# Patient Record
Sex: Female | Born: 1960 | Race: White | Hispanic: No | Marital: Married | Smoking: Never smoker
Health system: Southern US, Community
[De-identification: ages and names within clinical notes are randomized; demographics above are authoritative.]

## PROBLEM LIST (undated history)

## (undated) DIAGNOSIS — J45909 Unspecified asthma, uncomplicated: Secondary | ICD-10-CM

## (undated) DIAGNOSIS — T7840XA Allergy, unspecified, initial encounter: Secondary | ICD-10-CM

## (undated) DIAGNOSIS — F419 Anxiety disorder, unspecified: Secondary | ICD-10-CM

## (undated) DIAGNOSIS — IMO0002 Reserved for concepts with insufficient information to code with codable children: Secondary | ICD-10-CM

## (undated) DIAGNOSIS — R011 Cardiac murmur, unspecified: Secondary | ICD-10-CM

## (undated) HISTORY — DX: Cardiac murmur, unspecified: R01.1

## (undated) HISTORY — DX: Reserved for concepts with insufficient information to code with codable children: IMO0002

## (undated) HISTORY — DX: Unspecified asthma, uncomplicated: J45.909

## (undated) HISTORY — DX: Anxiety disorder, unspecified: F41.9

## (undated) HISTORY — PX: TONSILLECTOMY: SUR1361

## (undated) HISTORY — DX: Allergy, unspecified, initial encounter: T78.40XA

## (undated) HISTORY — PX: ABDOMINAL HYSTERECTOMY: SHX81

---

## 1999-10-06 ENCOUNTER — Emergency Department (HOSPITAL_COMMUNITY): Admission: EM | Admit: 1999-10-06 | Discharge: 1999-10-06 | Payer: Self-pay | Admitting: Internal Medicine

## 2001-08-09 ENCOUNTER — Emergency Department (HOSPITAL_COMMUNITY): Admission: EM | Admit: 2001-08-09 | Discharge: 2001-08-09 | Payer: Self-pay | Admitting: *Deleted

## 2001-08-09 ENCOUNTER — Encounter: Payer: Self-pay | Admitting: Emergency Medicine

## 2001-08-12 ENCOUNTER — Emergency Department (HOSPITAL_COMMUNITY): Admission: EM | Admit: 2001-08-12 | Discharge: 2001-08-12 | Payer: Self-pay | Admitting: Emergency Medicine

## 2005-04-08 ENCOUNTER — Other Ambulatory Visit: Admission: RE | Admit: 2005-04-08 | Discharge: 2005-04-08 | Payer: Self-pay | Admitting: Gynecology

## 2005-12-14 ENCOUNTER — Ambulatory Visit (HOSPITAL_COMMUNITY): Admission: RE | Admit: 2005-12-14 | Discharge: 2005-12-14 | Payer: Self-pay | Admitting: Orthopaedic Surgery

## 2007-03-12 ENCOUNTER — Other Ambulatory Visit: Admission: RE | Admit: 2007-03-12 | Discharge: 2007-03-12 | Payer: Self-pay | Admitting: Gynecology

## 2011-07-15 DIAGNOSIS — F419 Anxiety disorder, unspecified: Secondary | ICD-10-CM | POA: Insufficient documentation

## 2011-07-15 DIAGNOSIS — M1990 Unspecified osteoarthritis, unspecified site: Secondary | ICD-10-CM | POA: Insufficient documentation

## 2011-11-27 ENCOUNTER — Other Ambulatory Visit: Payer: Self-pay | Admitting: Family Medicine

## 2011-11-27 DIAGNOSIS — Z1231 Encounter for screening mammogram for malignant neoplasm of breast: Secondary | ICD-10-CM

## 2011-12-18 DIAGNOSIS — E66813 Obesity, class 3: Secondary | ICD-10-CM | POA: Insufficient documentation

## 2012-07-03 ENCOUNTER — Ambulatory Visit: Payer: Self-pay

## 2012-07-03 ENCOUNTER — Ambulatory Visit: Payer: Self-pay | Admitting: Internal Medicine

## 2012-07-03 VITALS — BP 131/82 | HR 94 | Temp 98.2°F | Resp 18 | Ht 63.5 in | Wt 245.0 lb

## 2012-07-03 DIAGNOSIS — IMO0002 Reserved for concepts with insufficient information to code with codable children: Secondary | ICD-10-CM

## 2012-07-03 MED ORDER — HYDROCODONE-ACETAMINOPHEN 5-325 MG PO TABS: 1.0000 | ORAL_TABLET | Freq: Four times a day (QID) | ORAL | Status: DC | PRN

## 2012-07-03 NOTE — Patient Instructions (Addendum)
Knee Sprain  A knee sprain is a tear in one of the strong, fibrous tissues that connect the bones (ligaments) in your knee. The severity of the sprain depends on how much of the ligament is torn. The tear can be either partial or complete.  CAUSES   Often, sprains are a result of a fall or injury. The force of the impact causes the fibers of your ligament to stretch too much. This excess tension causes the fibers of your ligament to tear.  SYMPTOMS   You may have some loss of motion in your knee. Other symptoms include:   Bruising.   Tenderness.   Swelling.  DIAGNOSIS   In order to diagnose knee sprain, your caregiver will physically examine your knee to determine how torn the ligament is. Your caregiver may also suggest an X-ray exam of your knee to make sure no bones are broken.  TREATMENT   If your ligament is only partially torn, treatment usually involves keeping the knee in a fixed position (immobilization) or bracing your knee for activities that require movement for several weeks. To do this, your caregiver will apply a bandage, cast, or splint to keep your knee from moving or support your knee during movement until it heals. For a partially torn ligament, the healing process usually takes 4 to 6 weeks.  If your ligament is completely torn, depending on which ligament it is, you may need surgery to reconnect the ligament to the bone or reconstruct it. After surgery, a cast or splint may be applied and will need to stay on your knee for 4 to 6 weeks while your ligament heals.  HOME CARE INSTRUCTIONS   Keep your injured knee elevated to decrease swelling.   To ease pain and swelling, apply ice to your knee twice a day, for 2 to 3 days:   Put ice in a plastic bag.   Place a towel between your skin and the bag.   Leave the ice on for 15 minutes.   Only take over-the-counter or prescription medicine for pain as directed by your caregiver.   Do not leave your knee unprotected until pain and stiffness go  away (usually 4 to 6 weeks).   Do not allow your cast or splint to get wet. If you have been instructed not to remove it, cover your cast or splint with a plastic bag when you shower or bathe. Do not swim.   Your caregiver may suggest exercises for you to do during your recovery to prevent or limit permanent weakness and stiffness.  SEEK IMMEDIATE MEDICAL CARE IF:   Your cast or splint becomes damaged.   Your pain becomes worse.  MAKE SURE YOU:   Understand these instructions.   Will watch your condition.   Will get help right away if you are not doing well or get worse.  Document Released: 04/22/2005 Document Revised: 07/15/2011 Document Reviewed: 04/06/2011  [employer] Patient Information 2013 [employer].

## 2012-07-03 NOTE — Progress Notes (Signed)
  Subjective:    Patient ID: Julie Howell, female    DOB: 26-Sep-1960, 52 y.o.   MRN: [id number]  HPI Over 2 weeks ago slipped on wet/snow floor and fell onto right knee. Pain has persisted and still can not flex knee due to pain and possible swelling. No previous knee injury. Also struck head when fell but no hx for concussion , memory intact, does have HAs but like her usual and no hx of NMS loss. Fell full weight ont right patella   Review of Systems     Objective:   Physical Exam  Vitals reviewed. Constitutional: She is oriented to person, place, and time. She appears well-nourished. She appears distressed.  Eyes: EOM are normal. No scleral icterus.  Pulmonary/Chest: Effort normal.  Musculoskeletal:       Right knee: She exhibits decreased range of motion, swelling, abnormal patellar mobility and bony tenderness. Tenderness found.  Neurological: She is alert and oriented to person, place, and time. No cranial nerve deficit. She exhibits normal muscle tone. Coordination and gait abnormal.  Neuro intact  Skin: Skin is warm.  Psychiatric: She has a normal mood and affect.    [facility] reading (PRIMARY) by  Dr Perrin Maltese no fx seen        Assessment & Plan:  Knee brace and crutches HC/Refer to orthopedist [place]

## 2015-07-03 ENCOUNTER — Emergency Department (INDEPENDENT_AMBULATORY_CARE_PROVIDER_SITE_OTHER): Payer: BLUE CROSS/BLUE SHIELD

## 2015-07-03 ENCOUNTER — Encounter (HOSPITAL_COMMUNITY): Payer: Self-pay | Admitting: Emergency Medicine

## 2015-07-03 ENCOUNTER — Emergency Department (HOSPITAL_COMMUNITY)
Admission: EM | Admit: 2015-07-03 | Discharge: 2015-07-03 | Disposition: A | Discharge status: Home / Self Care | Payer: BLUE CROSS/BLUE SHIELD | Source: Home / Self Care | Attending: Family Medicine | Admitting: Family Medicine

## 2015-07-03 DIAGNOSIS — S93402A Sprain of unspecified ligament of left ankle, initial encounter: Secondary | ICD-10-CM

## 2015-07-03 MED ORDER — TRAMADOL HCL 50 MG PO TABS: ORAL_TABLET | ORAL | Status: DC

## 2015-07-03 NOTE — ED Provider Notes (Signed)
CSN: [id number]     Arrival date & time 07/03/15  1536 History   First MD Initiated Contact with Patient 07/03/15 1810     Chief Complaint  Patient presents with  . Foot Pain   (Consider location/radiation/quality/duration/timing/severity/associated sxs/prior Treatment) HPI Comments: 55 year old female states that she stepped out of a bathtub approximately 2 weeks ago and twisted her left foot. She is complaining of persistent pain primarily to the lateral ankle and lateral aspect of the foot. It is worse with weightbearing and ambulation. She states her job requires standing for several hours during the day. She has not taken any time off for healing. She has applied ice.   Past Medical History  Diagnosis Date  . Allergy   . Asthma   . Ulcer   . Anxiety   . Heart murmur    Past Surgical History  Procedure Laterality Date  . Abdominal hysterectomy    . Tonsillectomy     Family History  Problem Relation Age of Onset  . Diabetes Father   . Heart disease Father   . Cancer Brother    Social History  Substance Use Topics  . Smoking status: Unknown If Ever Smoked  . Smokeless tobacco: None  . Alcohol Use: No   OB History    No data available     Review of Systems  Constitutional: Positive for activity change. Negative for fever and chills.  HENT: Negative.   Respiratory: Negative.   Cardiovascular: Negative.   Musculoskeletal: Positive for joint swelling and gait problem. Negative for myalgias, neck pain and neck stiffness.       As per HPI  Skin: Negative for color change, pallor and rash.  Neurological: Negative.     Allergies  Aspirin; Darvon; Ibuprofen; Macrodantin; and Penicillins  Home Medications   Prior to Admission medications   Medication Sig Start Date End Date Taking? Authorizing Provider  acetaminophen (TYLENOL) 325 MG tablet Take 650 mg by mouth every 6 (six) hours as needed.   Yes Historical Provider, MD  lisinopril (PRINIVIL,ZESTRIL) 10 MG tablet  Take 10 mg by mouth daily.   Yes Historical Provider, MD  phentermine 37.5 MG capsule Take 37.5 mg by mouth every morning.   Yes Historical Provider, MD  HYDROcodone-acetaminophen (NORCO/VICODIN) 5-325 MG per tablet Take 1 tablet by mouth every 6 (six) hours as needed for pain. 07/03/12   Jonita Albee, MD  traMADol Janean Sark) 50 MG tablet 1-2 tabs po q 6 hr prn pain Maximum dose= 8 tablets per day 07/03/15   Hayden Rasmussen, NP   Meds Ordered and Administered this Visit  Medications - No data to display  BP 146/76 mmHg  Pulse 87  Temp(Src) 98.2 F (36.8 C) (Oral)  Resp 16  SpO2 99% No data found.   Physical Exam  Constitutional: She is oriented to person, place, and time. She appears well-developed and well-nourished. No distress.  HENT:  Head: Normocephalic and atraumatic.  Eyes: EOM are normal.  Neck: Normal range of motion. Neck supple.  Cardiovascular: Normal rate.   Pulmonary/Chest: Effort normal. No respiratory distress.  Musculoskeletal:  Minor edema and tenderness surrounding the left lateral malleolus. No deformity. No discoloration. Tenderness along the proximal lateral foot. Dorsiflexion and plantar flexion is intact. Inversion and eversion intact but with pain. Distal neurovascular motor Sentry is intact. Pedal pulse 2+.  Neurological: She is alert and oriented to person, place, and time. No cranial nerve deficit. She exhibits normal muscle tone.  Skin: Skin is warm and  dry.  Nursing note and vitals reviewed.   ED Course  Procedures (including critical care time)  Labs Review Labs Reviewed - No data to display  Imaging Review Dg Ankle Complete Left  07/03/2015  CLINICAL DATA:  Pt twisted her ankle getting out of the bathtub a couple weeks ago, pt has tried tylenol and its not helping with pain and hurts to walk, swelling to the lateral side of her ankle EXAM: LEFT ANKLE COMPLETE - 3+ VIEW COMPARISON:  None. FINDINGS: There is no evidence of fracture, dislocation, or joint  effusion. There is no evidence of arthropathy or other focal bone abnormality. Soft tissues are unremarkable. IMPRESSION: Negative. Electronically Signed   By: Amie Portland M.D.   On: 07/03/2015 18:37     Visual Acuity Review  Right Eye Distance:   Left Eye Distance:   Bilateral Distance:    Right Eye Near:   Left Eye Near:    Bilateral Near:         MDM   1. Ankle sprain, left, initial encounter    Use her crutches for little to no weightbearing for the next 3-4 days. Then weightbearing as tolerated. ASO splint applied today. Keep elevated. Work note given. Tramadol 50 mg #20.        Hayden Rasmussen, NP 07/03/15 [phone]

## 2015-07-03 NOTE — Discharge Instructions (Signed)
Ankle Sprain  An ankle sprain is an injury to the strong, fibrous tissues (ligaments) that hold the bones of your ankle joint together.   CAUSES  An ankle sprain is usually caused by a fall or by twisting your ankle. Ankle sprains most commonly occur when you step on the outer edge of your foot, and your ankle turns inward. People who participate in sports are more prone to these types of injuries.   SYMPTOMS    Pain in your ankle. The pain may be present at rest or only when you are trying to stand or walk.   Swelling.   Bruising. Bruising may develop immediately or within 1 to 2 days after your injury.   Difficulty standing or walking, particularly when turning corners or changing directions.  DIAGNOSIS   Your caregiver will ask you details about your injury and perform a physical exam of your ankle to determine if you have an ankle sprain. During the physical exam, your caregiver will press on and apply pressure to specific areas of your foot and ankle. Your caregiver will try to move your ankle in certain ways. An X-ray exam may be done to be sure a bone was not broken or a ligament did not separate from one of the bones in your ankle (avulsion fracture).   TREATMENT   Certain types of braces can help stabilize your ankle. Your caregiver can make a recommendation for this. Your caregiver may recommend the use of medicine for pain. If your sprain is severe, your caregiver may refer you to a surgeon who helps to restore function to parts of your skeletal system (orthopedist) or a physical therapist.  HOME CARE INSTRUCTIONS    Apply ice to your injury for 1-2 days or as directed by your caregiver. Applying ice helps to reduce inflammation and pain.    Put ice in a plastic bag.    Place a towel between your skin and the bag.    Leave the ice on for 15-20 minutes at a time, every 2 hours while you are awake.   Only take over-the-counter or prescription medicines for pain, discomfort, or fever as directed by  your caregiver.   Elevate your injured ankle above the level of your heart as much as possible for 2-3 days.   If your caregiver recommends crutches, use them as instructed. Gradually put weight on the affected ankle. Continue to use crutches or a cane until you can walk without feeling pain in your ankle.   If you have a plaster splint, wear the splint as directed by your caregiver. Do not rest it on anything harder than a pillow for the first 24 hours. Do not put weight on it. Do not get it wet. You may take it off to take a shower or bath.   You may have been given an elastic bandage to wear around your ankle to provide support. If the elastic bandage is too tight (you have numbness or tingling in your foot or your foot becomes cold and blue), adjust the bandage to make it comfortable.   If you have an air splint, you may blow more air into it or let air out to make it more comfortable. You may take your splint off at night and before taking a shower or bath. Wiggle your toes in the splint several times per day to decrease swelling.  SEEK MEDICAL CARE IF:    You have rapidly increasing bruising or swelling.   Your toes feel   extremely cold or you lose feeling in your foot.   Your pain is not relieved with medicine.  SEEK IMMEDIATE MEDICAL CARE IF:   Your toes are numb or blue.   You have severe pain that is increasing.  MAKE SURE YOU:    Understand these instructions.   Will watch your condition.   Will get help right away if you are not doing well or get worse.     This information is not intended to replace advice given to you by your health care provider. Make sure you discuss any questions you have with your health care provider.     Document Released: 04/22/2005 Document Revised: 05/13/2014 Document Reviewed: 05/04/2011  Elsevier Interactive Patient Education 2016 [employer]

## 2015-07-03 NOTE — ED Notes (Signed)
Left foot pain for 2 weeks, no known injury.  Thinks might have started after stepping out of tub awkwardly.  Patient from left little toes, down lateral foot, to the back of foot.  .  Pulses 2 plus, able to wiggle toes

## 2016-06-30 ENCOUNTER — Encounter (HOSPITAL_COMMUNITY): Payer: Self-pay | Admitting: Emergency Medicine

## 2016-06-30 ENCOUNTER — Ambulatory Visit (HOSPITAL_COMMUNITY)
Admission: EM | Admit: 2016-06-30 | Discharge: 2016-06-30 | Disposition: A | Discharge status: Home / Self Care | Payer: BLUE CROSS/BLUE SHIELD | Attending: Family Medicine | Admitting: Family Medicine

## 2016-06-30 DIAGNOSIS — M25562 Pain in left knee: Secondary | ICD-10-CM | POA: Diagnosis not present

## 2016-06-30 MED ORDER — PREDNISONE 5 MG (21) PO TBPK: ORAL_TABLET | ORAL | 0 refills | Status: DC

## 2016-06-30 MED ORDER — TRAMADOL HCL 50 MG PO TABS: 50.0000 mg | ORAL_TABLET | Freq: Four times a day (QID) | ORAL | 0 refills | Status: AC | PRN

## 2016-06-30 NOTE — Discharge Instructions (Signed)
Sorry that your knee has flared up after all these years. The prednisone pack will help with pain and inflammation. The Tramadol will help for severe pain, especially at night. Wear your sleeve when up and ambulating, continue to ice and if worsens you will need to see an orthopedic for further evaluation.

## 2016-06-30 NOTE — ED Triage Notes (Signed)
[facility] a [facility] pain.

## 2016-06-30 NOTE — ED Provider Notes (Signed)
CSN: [id number]     Arrival date & time 06/30/16  1633 History   First MD Initiated Contact with Patient 06/30/16 1707     Chief Complaint  Patient presents with  . Knee Pain   (Consider location/radiation/quality/duration/timing/severity/associated sxs/prior Treatment)  56 yo female presents with 4 days of left knee pain. She carries a history of patellofemoral syndrome as a teenager, but no problems since that time. In the last 4 days her pain began from an unknown injury or trauma to the knee. She does a lot of walking for her job but nothing out of the ordinary. Last night her knee pain kept her awake. She denies swelling, erythema or warmth. No calf pain is noted.       Past Medical History:  Diagnosis Date  . Allergy   . Anxiety   . Asthma   . Heart murmur   . Ulcer [facility])    Past Surgical History:  Procedure Laterality Date  . ABDOMINAL HYSTERECTOMY    . TONSILLECTOMY     Family History  Problem Relation Age of Onset  . Diabetes Father   . Heart disease Father   . Cancer Brother    Social History  Substance Use Topics  . Smoking status: Unknown If Ever Smoked  . Smokeless tobacco: Not on file  . Alcohol use No   OB History    No data available     Review of Systems  All other systems reviewed and are negative.   Allergies  Naproxen; Aspirin; Darvon [propoxyphene hcl]; Ibuprofen; Macrodantin [nitrofurantoin macrocrystal]; and Penicillins  Home Medications   Prior to Admission medications   Medication Sig Start Date End Date Taking? Authorizing Provider  lisinopril (PRINIVIL,ZESTRIL) 10 MG tablet Take 10 mg by mouth daily.   Yes Historical Provider, MD  phentermine 37.5 MG capsule Take 37.5 mg by mouth every morning.   Yes Historical Provider, MD  predniSONE (STERAPRED UNI-PAK 21 TAB) 5 MG (21) TBPK tablet Take as directed 06/30/16   Riki Sheer, PA-C  traMADol (ULTRAM) 50 MG tablet Take 1 tablet (50 mg total) by mouth every 6 (six) hours as needed.  06/30/16   Riki Sheer, PA-C   Meds Ordered and Administered this Visit  Medications - No data to display  BP 120/61 (BP Location: Right Arm)   Pulse 92   Temp 98.3 F (36.8 C) (Oral)   Resp 18   SpO2 100%  No data found.   Physical Exam  Constitutional: She appears well-developed and well-nourished. No distress.  Musculoskeletal:  No swelling, erythema or warmth of the left knee. No calf swelling or tenderness. Pain to palpation along the anterior knee with pain with full flexion. No effusion  Neurological: She is alert.  Skin: Skin is warm and dry. She is not diaphoretic.  Psychiatric: Her behavior is normal.  Nursing note and vitals reviewed.   Urgent Care Course     Procedures (including critical care time)  Labs Review Labs Reviewed - No data to display  Imaging Review No results found.   Visual Acuity Review  Right Eye Distance:   Left Eye Distance:   Bilateral Distance:    Right Eye Near:   Left Eye Near:    Bilateral Near:         MDM   1. Acute pain of left knee    Etiology unclear. No indication for xray at this time. Will place in a knee sleeve. Treat with pred pack (allergic to NSAIDs)  and Tramadol for more severe pain. She is to f/u with Orthopedics if her pain does not subside.     Riki SheerMichelle G Marshaun Lortie, PA-C 06/30/16 1729

## 2017-07-19 IMAGING — DX DG ANKLE COMPLETE 3+V*L*
3 series · 3 of 3 positions shown · non-contrast
Comparison: None.

CLINICAL DATA: Pt twisted her ankle getting out of the bathtub a
couple weeks ago, pt has tried tylenol and its not helping with pain
and hurts to walk, swelling to the lateral side of her ankle

EXAM:
LEFT ANKLE COMPLETE - 3+ VIEW

[ankle ap]
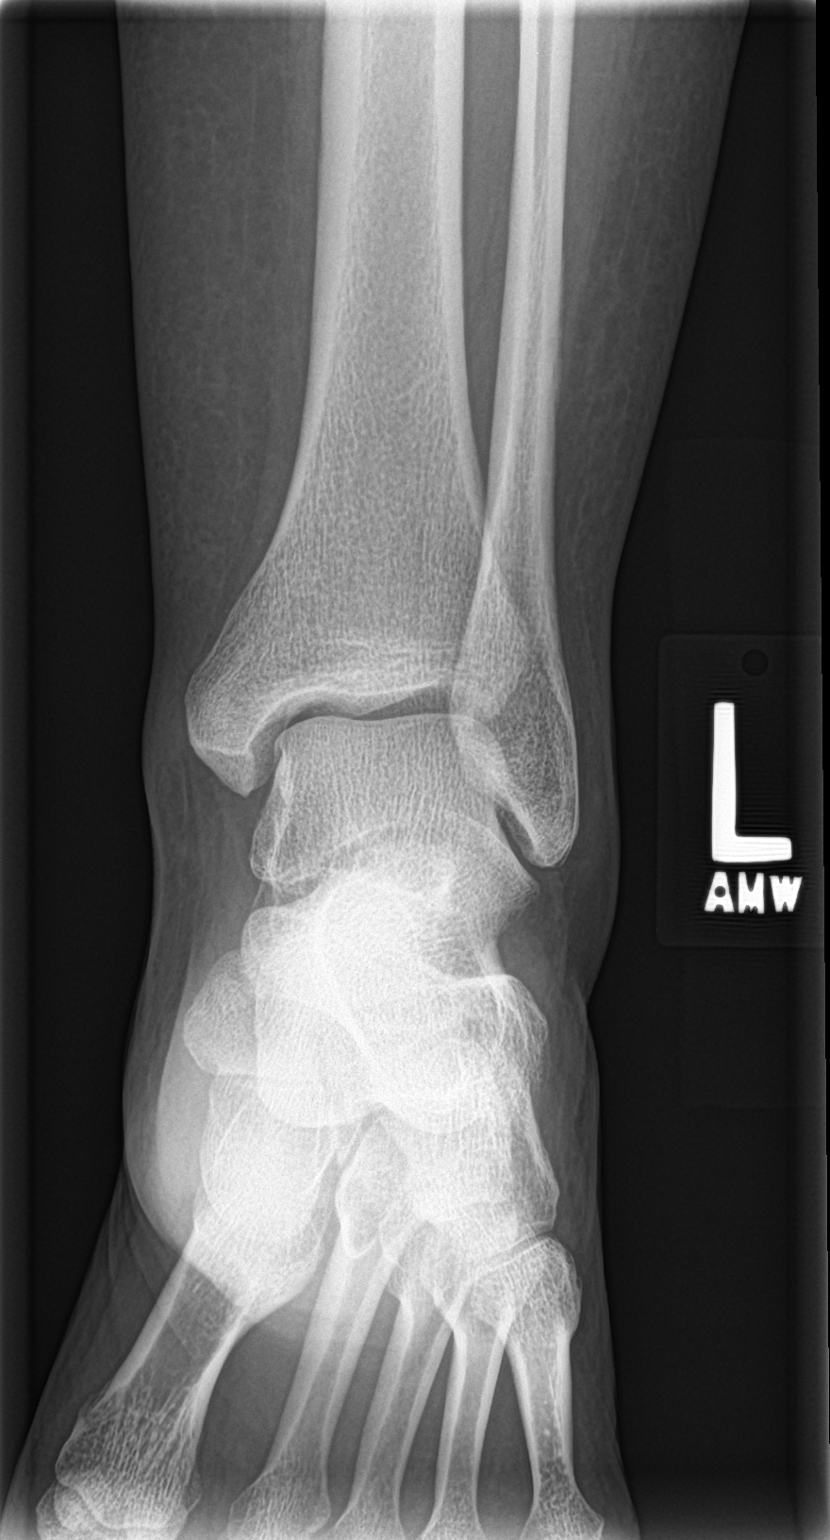

[ankle obl]
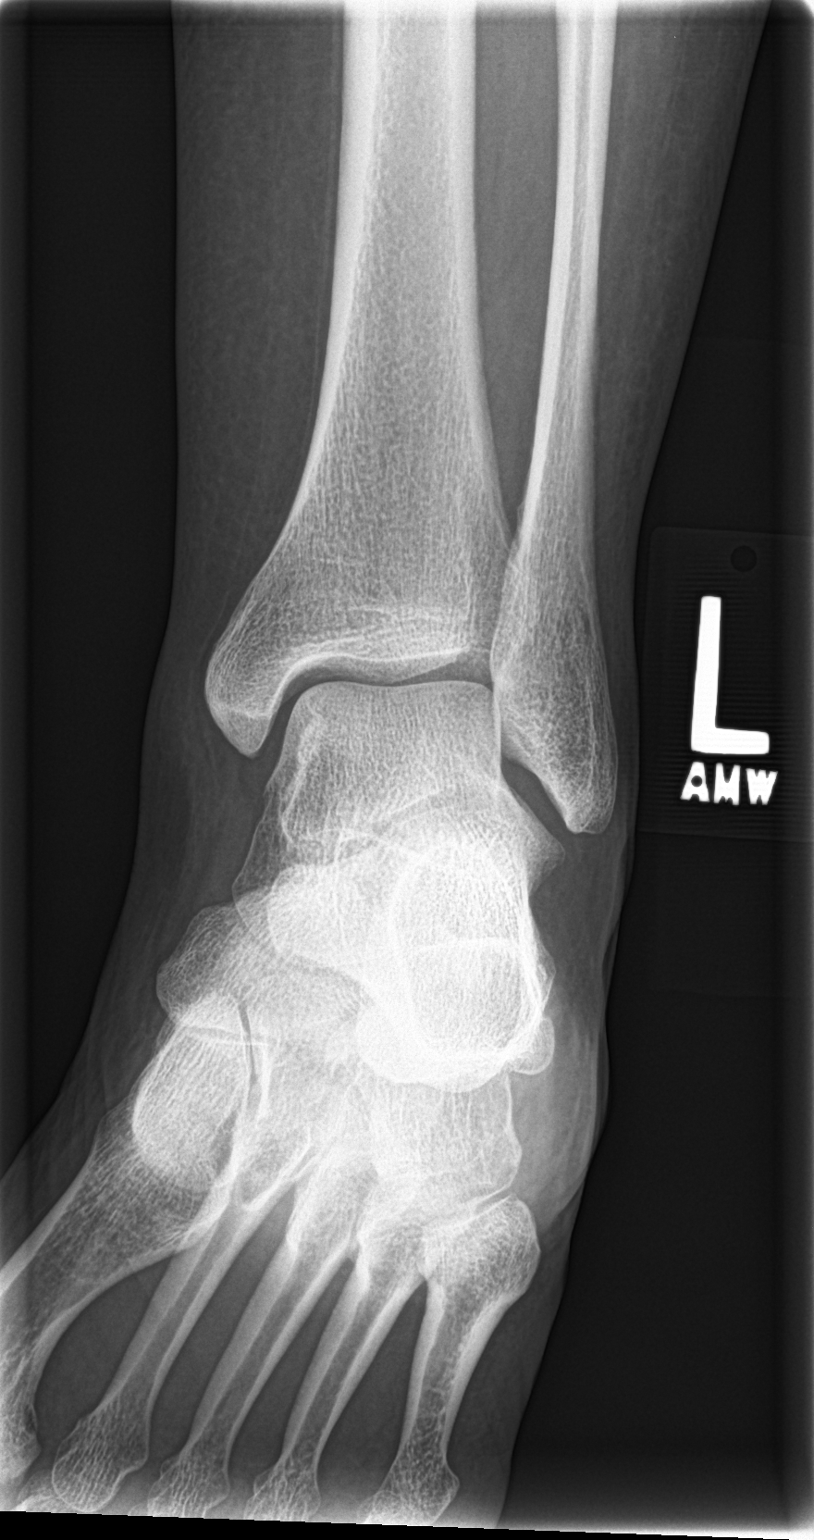

[ankle lat]
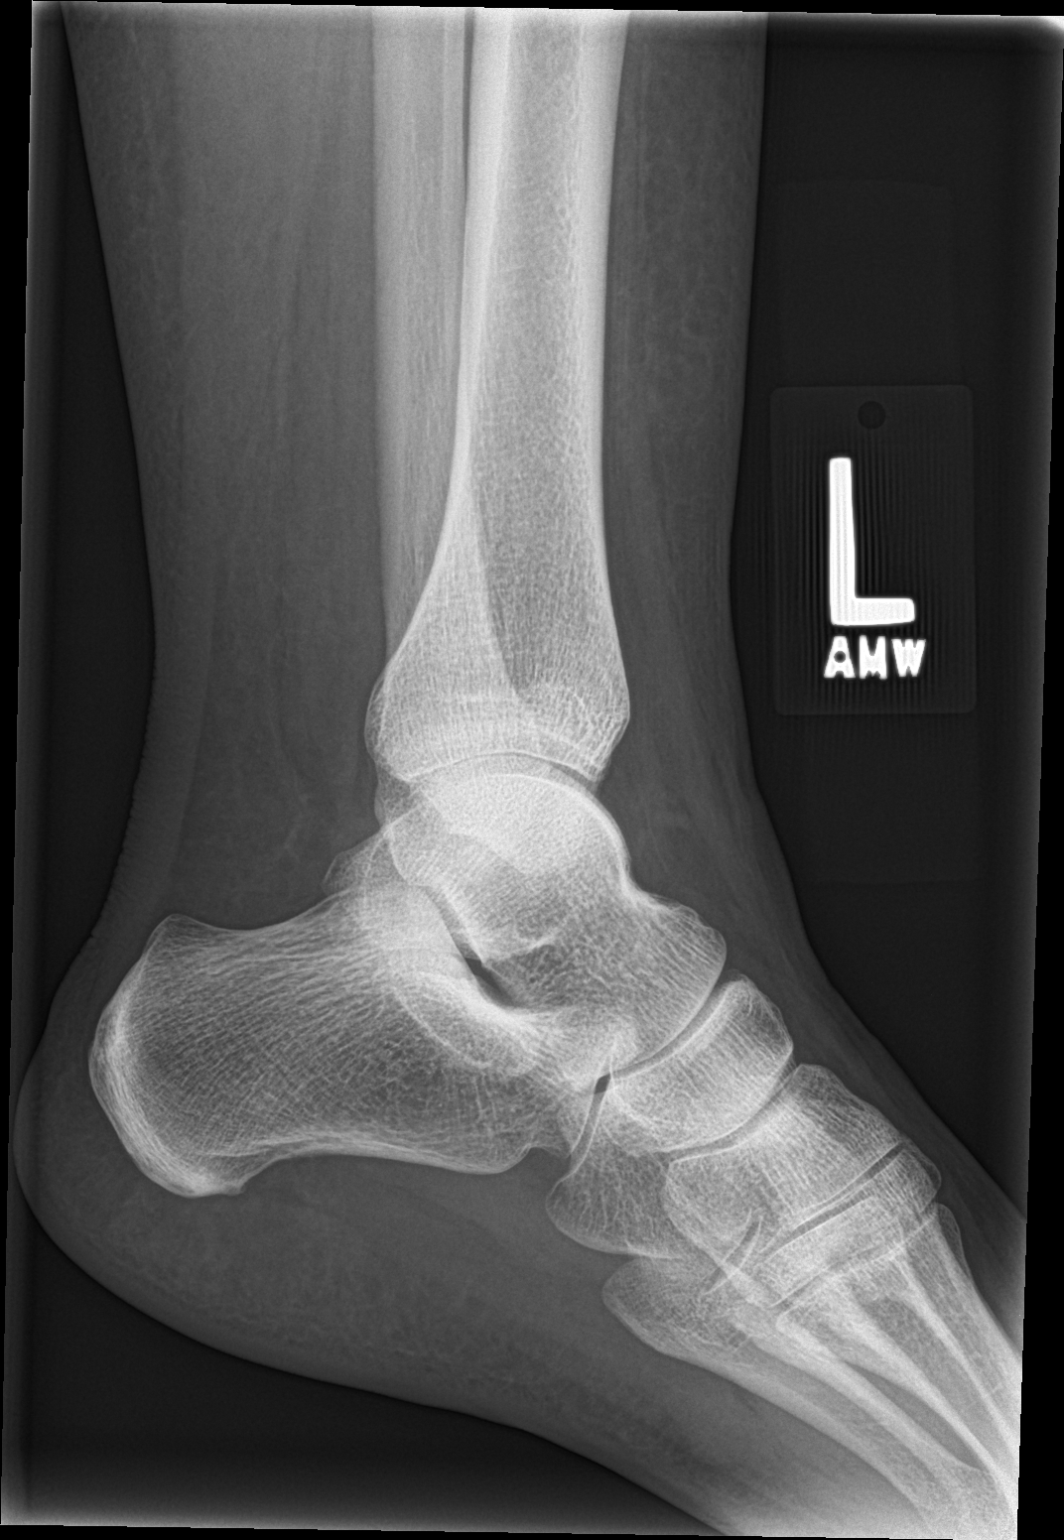

[3 of 3 positions shown; findings below may reference images not displayed]

FINDINGS: There is no evidence of fracture, dislocation, or joint effusion.
There is no evidence of arthropathy or other focal bone abnormality.
Soft tissues are unremarkable.
IMPRESSION: Negative.

## 2023-10-12 ENCOUNTER — Ambulatory Visit
Admission: EM | Admit: 2023-10-12 | Discharge: 2023-10-12 | Disposition: A | Discharge status: Home / Self Care | Payer: Medicare (Managed Care) | Attending: Physician Assistant | Admitting: Physician Assistant

## 2023-10-12 DIAGNOSIS — I10 Essential (primary) hypertension: Secondary | ICD-10-CM | POA: Insufficient documentation

## 2023-10-12 DIAGNOSIS — N3000 Acute cystitis without hematuria: Secondary | ICD-10-CM | POA: Insufficient documentation

## 2023-10-12 LAB — POCT URINALYSIS DIP (MANUAL ENTRY)
Glucose, UA: NEGATIVE mg/dL
Ketones, POC UA: NEGATIVE mg/dL
Nitrite, UA: NEGATIVE
Protein Ur, POC: 100 mg/dL — AB
Spec Grav, UA: >=1.03 — AB (ref 1.010–1.025)
Urobilinogen, UA: 0.2 U/dL
pH, UA: 6 (ref 5.0–8.0)

## 2023-10-12 MED ORDER — CIPROFLOXACIN HCL 500 MG PO TABS: 500.0000 mg | ORAL_TABLET | Freq: Two times a day (BID) | ORAL | 0 refills | Status: AC

## 2023-10-12 NOTE — ED Triage Notes (Addendum)
"  This started with 3 wks ago with urine odor then led to urgency/frequency". Fever this morning "up to 101.3".

## 2023-10-12 NOTE — ED Provider Notes (Signed)
 [facility]    CSN: [id number] Arrival date & time: 10/12/23  1533      History   Chief Complaint Chief Complaint  Patient presents with   UTI Symptoms    HPI Julie Howell is a 63 y.o. female.   Patient here today for evaluation of dysuria, low back pain that started several weeks ago.  She reports she has had urinary frequency as well.  She denies any vomiting but has had some nausea.  She also notes that she had fever this morning.  Talk  The history is provided by the patient.    Past Medical History:  Diagnosis Date   Allergy    Anxiety    Asthma    Heart murmur    Ulcer     Patient Active Problem List   Diagnosis Date Noted   Hypertension 10/12/2023   Obesity, Class III, BMI 40-49.9 (morbid obesity) 12/18/2011   Anxiety 07/15/2011   Arthritis 07/15/2011    Past Surgical History:  Procedure Laterality Date   ABDOMINAL HYSTERECTOMY     TONSILLECTOMY      OB History   No obstetric history on file.      Home Medications    Prior to Admission medications   Medication Sig Start Date End Date Taking? Authorizing Provider  acyclovir (ZOVIRAX) 200 MG capsule Take 200 mg by mouth 3 (three) times daily. 07/22/12  Yes [provider]  ALPRAZolam (XANAX) 0.5 MG tablet Take 0.5 mg by mouth at bedtime. 07/21/12  Yes [provider]  ciprofloxacin (CIPRO) 500 MG tablet Take 1 tablet (500 mg total) by mouth every 12 (twelve) hours. 10/12/23  Yes Vernestine Gondola, PA-C  HYDROcodone  bit-homatropine (HYCODAN) 5-1.5 MG/5ML syrup Take 5 mLs by mouth at bedtime. 09/18/23   [provider]  HYDROcodone -acetaminophen  (NORCO) 10-325 MG tablet Take 1-2 tablets by mouth every 12 (twelve) hours. 09/12/23  Yes [provider]  Cholecalciferol (KP VITAMIN D) 25 MCG (1000 UT) capsule Take 1,000 Units by mouth daily.    [provider]  cyanocobalamin ([employer] VITAMIN B12) 1000 MCG tablet Take by mouth daily.    [provider]   lisinopril (PRINIVIL,ZESTRIL) 10 MG tablet Take 10 mg by mouth daily.    [provider]  phentermine 37.5 MG capsule Take 37.5 mg by mouth every morning.    [provider]  predniSONE  (STERAPRED UNI-PAK 21 TAB) 5 MG (21) TBPK tablet Take as directed 06/30/16   Gideons Lalama, Deanna M, PA-C  traMADol  (ULTRAM ) 50 MG tablet Take 1 tablet (50 mg total) by mouth every 6 (six) hours as needed. 06/30/16   Gideons Lalama, Deanna M, PA-C    Family History Family History  Problem Relation Age of Onset   Diabetes Father    Heart disease Father    Cancer Brother     Social History Social History   Tobacco Use   Smoking status: Never   Smokeless tobacco: Never  Vaping Use   Vaping status: Never Used  Substance Use Topics   Alcohol use: No   Drug use: No     Allergies   Ibuprofen, Naproxen, Nitrofurantoin macrocrystal, Other, Penicillins, Aspirin, and Darvon [propoxyphene hcl]   Review of Systems Review of Systems  Constitutional:  Negative for chills and fever.  Eyes:  Negative for discharge and redness.  Respiratory:  Negative for shortness of breath.   Gastrointestinal:  Negative for abdominal pain, nausea and vomiting.  Genitourinary:  Positive for dysuria.  Musculoskeletal:  Positive for back pain.     Physical Exam Triage Vital Signs ED Triage Vitals  Encounter Vitals Group     BP      Systolic BP Percentile      Diastolic BP Percentile      Pulse      Resp      Temp      Temp src      SpO2      Weight      Height      Head Circumference      Peak Flow      Pain Score      Pain Loc      Pain Education      Exclude from Growth Chart    No data found.  Updated Vital Signs BP (!) 179/70 (BP Location: Right Arm)   Pulse 97   Temp 98.1 F (36.7 C) (Oral)   Resp 20   Ht 5' 3.5" (1.613 m)   Wt 244 lb 14.9 oz (111.1 kg)   SpO2 96%   BMI 42.71 kg/m   Visual Acuity Right Eye Distance:   Left Eye Distance:   Bilateral Distance:     Right Eye Near:   Left Eye Near:    Bilateral Near:     Physical Exam Vitals and nursing note reviewed.  Constitutional:      General: She is not in acute distress.    Appearance: Normal appearance. She is not ill-appearing.  HENT:     Head: Normocephalic and atraumatic.  Eyes:     Conjunctiva/sclera: Conjunctivae normal.  Cardiovascular:     Rate and Rhythm: Normal rate.  Pulmonary:     Effort: Pulmonary effort is normal. No respiratory distress.  Neurological:     Mental Status: She is alert.  Psychiatric:        Mood and Affect: Mood normal.        Behavior: Behavior normal.        Thought Content: Thought content normal.      UC Treatments / Results  Labs (all labs ordered are listed, but only abnormal results are displayed) Labs Reviewed  POCT URINALYSIS DIP (MANUAL ENTRY) - Abnormal; Notable for the following components:      Result Value   Clarity, UA cloudy (*)    Bilirubin, UA small (*)    Spec Grav, UA >=1.030 (*)    Blood, UA trace-intact (*)    Protein Ur, POC =100 (*)    Leukocytes, UA Trace (*)    All other components within normal limits  URINE CULTURE    EKG   Radiology No results found.  Procedures Procedures (including critical care time)  Medications Ordered in UC Medications - No data to display  Initial Impression / Assessment and Plan / UC Course  I have reviewed the triage vital signs and the nursing notes.  Pertinent labs & imaging results that were available during my care of the patient were reviewed by me and considered in my medical decision making (see chart for details).    Will treat to cover UTI with Cipro.  Urine culture ordered.  Recommended further evaluation in the emergency room with any worsening symptoms.  Encouraged follow-up if symptoms persist.   Final Clinical Impressions(s) / UC Diagnoses   Final diagnoses:  Acute cystitis without hematuria   Discharge Instructions   None    ED Prescriptions      Medication Sig Dispense Auth. Provider   ciprofloxacin (CIPRO) 500  MG tablet Take 1 tablet (500 mg total) by mouth every 12 (twelve) hours. 10 tablet Vernestine Gondola, PA-C      PDMP not reviewed this encounter.   Vernestine Gondola, PA-C 10/12/23 [phone]

## 2023-10-13 LAB — URINE CULTURE: Culture: NO GROWTH

## 2023-10-15 ENCOUNTER — Ambulatory Visit (HOSPITAL_COMMUNITY): Payer: Self-pay

## 2023-10-16 NOTE — Telephone Encounter (Signed)
 Pt returned call. Advised pt per protocol to d/c abx and return for re-eval if symptoms persist/return. Pt verbalized understanding.

## 2023-11-14 DIAGNOSIS — G8929 Other chronic pain: Secondary | ICD-10-CM | POA: Diagnosis not present

## 2023-12-04 DIAGNOSIS — G8929 Other chronic pain: Secondary | ICD-10-CM | POA: Diagnosis not present

## 2024-01-19 DIAGNOSIS — E669 Obesity, unspecified: Secondary | ICD-10-CM | POA: Diagnosis not present

## 2024-05-24 ENCOUNTER — Encounter: Payer: Self-pay | Admitting: Emergency Medicine

## 2024-05-24 ENCOUNTER — Ambulatory Visit: Payer: Self-pay

## 2024-05-24 ENCOUNTER — Ambulatory Visit
Admission: EM | Admit: 2024-05-24 | Discharge: 2024-05-24 | Disposition: A | Discharge status: Home / Self Care | Payer: Self-pay | Attending: Family Medicine | Admitting: Family Medicine

## 2024-05-24 DIAGNOSIS — S8002XA Contusion of left knee, initial encounter: Secondary | ICD-10-CM

## 2024-05-24 MED ORDER — METHYLPREDNISOLONE 4 MG PO TBPK: ORAL_TABLET | ORAL | 0 refills | Status: AC

## 2024-05-24 NOTE — ED Provider Notes (Signed)
 " [facility]    CSN: [id number] Arrival date & time: 05/24/24  1633      History   Chief Complaint Chief Complaint  Patient presents with   Knee Pain    L knee   Fall    HPI Julie Howell is a 64 y.o. female.   Pt with a hx of arthritis, presents today due to injury to left knee when she tripped over feet and fell on her left knee in her bathroom 3 days ago. Pt states that she uses Norco for chronic knee pain but it is not touch the acute knee pain. Pt states that the most intense pain is on the lateral aspect of left knee and shoots down to her foot. Pt states that when she walk her knee feels unstable under her weight.  The history is provided by the patient.  Knee Pain Fall    Past Medical History:  Diagnosis Date   Allergy    Anxiety    Asthma    Heart murmur    Ulcer     Patient Active Problem List   Diagnosis Date Noted   Hypertension 10/12/2023   Obesity, Class III, BMI 40-49.9 (morbid obesity) (HCC) 12/18/2011   Anxiety 07/15/2011   Arthritis 07/15/2011    Past Surgical History:  Procedure Laterality Date   ABDOMINAL HYSTERECTOMY     TONSILLECTOMY      OB History   No obstetric history on file.      Home Medications    Prior to Admission medications  Medication Sig Start Date End Date Taking? Authorizing Provider  HYDROcodone -acetaminophen  (NORCO) 10-325 MG tablet Take 1-2 tablets by mouth every 12 (twelve) hours. 09/12/23  Yes [provider]  methylPREDNISolone  (MEDROL  DOSEPAK) 4 MG TBPK tablet Take as directed on back of package 05/24/24  Yes Andra Corean BROCKS, PA-C  phentermine (ADIPEX-P) 37.5 MG tablet Take 37.5 mg by mouth daily as needed. 05/19/24  Yes [provider]  phentermine 37.5 MG capsule Take 37.5 mg by mouth every morning.   Yes [provider]  acyclovir (ZOVIRAX) 200 MG capsule Take 200 mg by mouth 3 (three) times daily. 07/22/12   [provider]  ALPRAZolam (XANAX) 0.5 MG  tablet Take 0.5 mg by mouth at bedtime. 07/21/12   [provider]  Cholecalciferol (KP VITAMIN D) 25 MCG (1000 UT) capsule Take 1,000 Units by mouth daily.    [provider]  ciprofloxacin  (CIPRO ) 500 MG tablet Take 1 tablet (500 mg total) by mouth every 12 (twelve) hours. 10/12/23   Billy Asberry FALCON, PA-C  cyanocobalamin ([employer] VITAMIN B12) 1000 MCG tablet Take by mouth daily.    [provider]  HYDROcodone  bit-homatropine (HYCODAN) 5-1.5 MG/5ML syrup Take 5 mLs by mouth at bedtime. 09/18/23   [provider]  lisinopril (PRINIVIL,ZESTRIL) 10 MG tablet Take 10 mg by mouth daily.    [provider]  traMADol  (ULTRAM ) 50 MG tablet Take 1 tablet (50 mg total) by mouth every 6 (six) hours as needed. 06/30/16   Gideons Lalama, Deanna M, PA-C    Family History Family History  Problem Relation Age of Onset   Diabetes Father    Heart disease Father    Cancer Brother     Social History Social History[1]   Allergies   Ibuprofen, Naproxen, Nitrofurantoin macrocrystal, Other, Penicillins, Aspirin, and Darvon [propoxyphene hcl]   Review of Systems Review of Systems   Physical Exam Triage Vital Signs ED Triage Vitals  Encounter  Vitals Group     BP 05/24/24 1754 (!) 173/98     Girls Systolic BP Percentile --      Girls Diastolic BP Percentile --      Boys Systolic BP Percentile --      Boys Diastolic BP Percentile --      Pulse Rate 05/24/24 1754 94     Resp 05/24/24 1754 18     Temp 05/24/24 1754 98.3 F (36.8 C)     Temp Source 05/24/24 1754 Oral     SpO2 05/24/24 1754 99 %     Weight 05/24/24 1754 244 lb 14.9 oz (111.1 kg)     Height --      Head Circumference --      Peak Flow --      Pain Score 05/24/24 1752 10     Pain Loc --      Pain Education --      Exclude from Growth Chart --    No data found.  Updated Vital Signs BP (!) 173/98 (BP Location: Left Arm)   Pulse 94   Temp 98.3 F (36.8 C) (Oral)   Resp 18   Wt 244 lb  14.9 oz (111.1 kg)   SpO2 99%   BMI 42.71 kg/m   Visual Acuity Right Eye Distance:   Left Eye Distance:   Bilateral Distance:    Right Eye Near:   Left Eye Near:    Bilateral Near:     Physical Exam Vitals and nursing note reviewed.  Constitutional:      General: She is not in acute distress.    Appearance: Normal appearance. She is not ill-appearing, toxic-appearing or diaphoretic.  Eyes:     General: No scleral icterus. Cardiovascular:     Rate and Rhythm: Normal rate and regular rhythm.     Heart sounds: Normal heart sounds.  Pulmonary:     Effort: Pulmonary effort is normal. No respiratory distress.     Breath sounds: Normal breath sounds. No wheezing or rhonchi.  Musculoskeletal:     Left knee: Tenderness present over the lateral joint line.  Skin:    General: Skin is warm.  Neurological:     Mental Status: She is alert and oriented to person, place, and time.  Psychiatric:        Mood and Affect: Mood normal.        Behavior: Behavior normal.      UC Treatments / Results  Labs (all labs ordered are listed, but only abnormal results are displayed) Labs Reviewed - No data to display  EKG   Radiology DG Knee Complete 4 Views Left Result Date: 05/24/2024 EXAM: 4 VIEW(S) XRAY OF THE LEFT KNEE 05/24/2024 06:26:48 PM COMPARISON: None available. CLINICAL HISTORY: Fall, L knee pain. FINDINGS: BONES AND JOINTS: Small focal lucency is seen in the region of the medial femoral condyle. Degenerative changes with bony spurs in medial, lateral and patellofemoral compartments. Narrowing of joint space in the medial compartment. Soft tissue fullness in the suprapatellar bursa suggesting small effusion. No malalignment. SOFT TISSUES: Soft tissue fullness in the suprapatellar bursa suggesting small effusion. IMPRESSION: 1. No acute fracture or dislocation. 2. Small suprapatellar joint effusion. 3. Small focal lucency in the medial femoral condyle, indeterminate, possibly  osteochondral injury . 4. 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 Electronically signed by: Greig Pique MD 05/24/2024 06:42 PM EST RP Workstation: [id number]    Procedures Procedures (including critical care time)  Medications Ordered in UC Medications -  No data to display  Initial Impression / Assessment and Plan / UC Course  I have reviewed the triage vital signs and the nursing notes.  Pertinent labs & imaging results that were available during my care of the patient were reviewed by me and considered in my medical decision making (see chart for details).     Final Clinical Impressions(s) / UC Diagnoses   Final diagnoses:  Contusion of left knee, initial encounter     Discharge Instructions      Will prescribed steroid dose pack for inflammation. You may take Tylenol  with meds. Please follow-up with orthopedist for symptoms.     ED Prescriptions     Medication Sig Dispense Auth. Provider   methylPREDNISolone  (MEDROL  DOSEPAK) 4 MG TBPK tablet Take as directed on back of package 21 tablet Andra Corean BROCKS, PA-C      PDMP not reviewed this encounter.    [1]  Social History Tobacco Use   Smoking status: Never    Passive exposure: Never   Smokeless tobacco: Never  Vaping Use   Vaping status: Never Used  Substance Use Topics   Alcohol use: No   Drug use: No     Andra Corean BROCKS, PA-C 05/24/24 1931  "

## 2024-05-24 NOTE — ED Triage Notes (Signed)
 Pt presents c/o a fall L knee pain x 3 days. Pt states,  I fell Friday and now I have this spot on my left knee that feels like something is grabbing from inside. My dr gave me pain meds but they don't ease the pain.I cannot even put pressure on the leg.

## 2024-05-24 NOTE — Discharge Instructions (Addendum)
 Will prescribed steroid dose pack for inflammation. You may take Tylenol  with meds. Please follow-up with orthopedist for symptoms.

## 2024-05-25 ENCOUNTER — Ambulatory Visit (HOSPITAL_COMMUNITY): Payer: Self-pay
